# Patient Record
Sex: Female | Born: 2000 | Race: Black or African American | Hispanic: No | Marital: Single | Smoking: Never smoker
Health system: Southern US, Community
[De-identification: ages and names within clinical notes are randomized; demographics above are authoritative.]

## PROBLEM LIST (undated history)

## (undated) DIAGNOSIS — J45909 Unspecified asthma, uncomplicated: Secondary | ICD-10-CM

---
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
## 2022-02-19 ENCOUNTER — Encounter (HOSPITAL_COMMUNITY): Payer: Self-pay

## 2022-02-19 ENCOUNTER — Other Ambulatory Visit: Payer: Self-pay

## 2022-02-19 ENCOUNTER — Emergency Department (HOSPITAL_COMMUNITY)
Admission: EM | Admit: 2022-02-19 | Discharge: 2022-02-19 | Disposition: A | Discharge status: Home / Self Care | Payer: Managed Care, Other (non HMO) | Attending: Emergency Medicine | Admitting: Emergency Medicine

## 2022-02-19 DIAGNOSIS — B2790 Infectious mononucleosis, unspecified without complication: Secondary | ICD-10-CM | POA: Insufficient documentation

## 2022-02-19 DIAGNOSIS — J029 Acute pharyngitis, unspecified: Secondary | ICD-10-CM | POA: Insufficient documentation

## 2022-02-19 HISTORY — DX: Unspecified asthma, uncomplicated: J45.909

## 2022-02-19 LAB — CBC WITH DIFFERENTIAL/PLATELET
Abs Immature Granulocytes: 0 10*3/uL (ref 0.00–0.07)
Basophils Absolute: 0.1 10*3/uL (ref 0.0–0.1)
Basophils Relative: 1 %
Eosinophils Absolute: 0 10*3/uL (ref 0.0–0.5)
Eosinophils Relative: 0 %
HCT: 39.4 % (ref 36.0–46.0)
Hemoglobin: 12.5 g/dL (ref 12.0–15.0)
Lymphocytes Relative: 53 %
Lymphs Abs: 4.1 10*3/uL — ABNORMAL HIGH (ref 0.7–4.0)
MCH: 25.3 pg — ABNORMAL LOW (ref 26.0–34.0)
MCHC: 31.7 g/dL (ref 30.0–36.0)
MCV: 79.8 fL — ABNORMAL LOW (ref 80.0–100.0)
Monocytes Absolute: 0.3 10*3/uL (ref 0.1–1.0)
Monocytes Relative: 4 %
Neutro Abs: 3.3 10*3/uL (ref 1.7–7.7)
Neutrophils Relative %: 42 %
Platelets: 326 10*3/uL (ref 150–400)
RBC: 4.94 MIL/uL (ref 3.87–5.11)
RDW: 14.3 % (ref 11.5–15.5)
WBC: 7.8 10*3/uL (ref 4.0–10.5)
nRBC: 0 % (ref 0.0–0.2)
nRBC: 0 /100 WBC

## 2022-02-19 LAB — BASIC METABOLIC PANEL
Anion gap: 9 (ref 5–15)
BUN: 8 mg/dL (ref 6–20)
CO2: 24 mmol/L (ref 22–32)
Calcium: 9.1 mg/dL (ref 8.9–10.3)
Chloride: 105 mmol/L (ref 98–111)
Creatinine, Ser: 0.57 mg/dL (ref 0.44–1.00)
GFR, Estimated: >60 mL/min (ref >60)
Glucose, Bld: 98 mg/dL (ref 70–99)
Potassium: 3.8 mmol/L (ref 3.5–5.1)
Sodium: 138 mmol/L (ref 135–145)

## 2022-02-19 MED ORDER — KETOROLAC TROMETHAMINE 15 MG/ML IJ SOLN
15.0000 mg | Freq: Once | INTRAMUSCULAR | Status: AC
  Administered 2022-02-19: 15 mg via INTRAVENOUS
  Filled 2022-02-19: qty 1

## 2022-02-19 MED ORDER — SODIUM CHLORIDE 0.9 % IV SOLN
10.0000 mg | Freq: Once | INTRAVENOUS | Status: AC
  Administered 2022-02-19: 10 mg via INTRAVENOUS
  Filled 2022-02-19: qty 1

## 2022-02-19 MED ORDER — SODIUM CHLORIDE 0.9 % IV BOLUS
1000.0000 mL | Freq: Once | INTRAVENOUS | Status: AC
  Administered 2022-02-19: 1000 mL via INTRAVENOUS

## 2023-01-03 ENCOUNTER — Encounter: Payer: Self-pay | Admitting: Family

## 2023-01-03 ENCOUNTER — Ambulatory Visit (INDEPENDENT_AMBULATORY_CARE_PROVIDER_SITE_OTHER): Payer: 59 | Admitting: Family

## 2023-01-03 VITALS — BP 100/67 | HR 84 | Temp 97.8°F | Ht 66.0 in | Wt 100.2 lb

## 2023-01-03 DIAGNOSIS — M549 Dorsalgia, unspecified: Secondary | ICD-10-CM | POA: Diagnosis not present

## 2023-01-03 DIAGNOSIS — N399 Disorder of urinary system, unspecified: Secondary | ICD-10-CM

## 2023-01-03 DIAGNOSIS — J4520 Mild intermittent asthma, uncomplicated: Secondary | ICD-10-CM

## 2023-01-03 MED ORDER — ALBUTEROL SULFATE HFA 108 (90 BASE) MCG/ACT IN AERS: 1.0000 | INHALATION_SPRAY | RESPIRATORY_TRACT | 5 refills | Status: DC | PRN

## 2023-01-03 MED ORDER — NAPROXEN 500 MG PO TABS: 500.0000 mg | ORAL_TABLET | Freq: Two times a day (BID) | ORAL | 0 refills | Status: DC | PRN

## 2023-01-03 NOTE — Patient Instructions (Signed)
Welcome to [facility], It was a pleasure meeting you today!    As discussed, I have sent your Albuterol refill to your pharmacy.  I have sent a referral to our sports medicine office regarding your back pain.  Let us know if any future concerns.    PLEASE NOTE: If you had any LAB tests please let us know if you have not heard back within a few days. You may see your results on MyChart before we have a chance to review them but we will give you a call once they are reviewed by [place]. If we ordered any REFERRALS today, please let us know if you have not heard from their office within the next week.  Let us know through MyChart if you are needing REFILLS, or have your pharmacy send [place] the request. You can also use MyChart to communicate with me or any office staff.

## 2023-01-03 NOTE — Progress Notes (Unsigned)
New Patient Office Visit  Subjective:  Patient ID: Shelley Mcgee, female    DOB: [birth year]  Age: 22 y.o. MRN: [id number]  CC:  Chief Complaint  Patient presents with   New Patient (Initial Visit)   Urinary Retention    Pt c/o unable to control urination, Present for 6 months.    Back Pain    Pt c/o upper back pain, diagnosed with scoliosis in the past.    Asthma    Medication refill of albuterol.     HPI Shelley Mcgee presents for establishing care today.  Upper back pain:  has mild scoliosis in her upper, thoracic spine and lumbar. 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 Reports she has tried to do some stretching to help but twice a day but has not been consistent.  Asthma:  using albuterol rescue inhaler, states her sx are mostly exercise induced.   Urinary problem:  reports having the urge frequently to urinate and if she doesn't get there in time she will leak urine. Was seeing a GYN who started Ditropan but d/t possible issues with wt loss & she wants to gain weight, she has not started. She also was referred to urology but they could not see them for a long time. She has cut back on her caffeine intake, and her stress level also is better and she thinks she is doing better overall. She is on wait list with Urology.  Assessment & Plan:  Mild intermittent asthma without complication -     Albuterol Sulfate HFA; Inhale 1-2 puffs into the lungs every 4 (four) hours as needed for wheezing or shortness of breath.  Dispense: 8 g; Refill: 5  Upper back pain -     Naproxen; Take 1 tablet (500 mg total) by mouth 2 (two) times daily as needed for moderate pain.  Dispense: 60 tablet; Refill: 0 -     Ambulatory referral to Sports Medicine    Subjective:    Outpatient Medications Prior to Visit  Medication Sig Dispense Refill   albuterol (PROAIR HFA) 108 (90 Base) MCG/ACT inhaler INL 2 PFS PO Q 4 TO 6 H PRF BREATHING DIFFICULTIES PRN      medroxyPROGESTERone (DEPO-PROVERA) 150 MG/ML injection INJECT 1 ml Intramuscular every 3 months 1 mL 3   oxybutynin (DITROPAN-XL) 5 MG 24 hr tablet Take 5 mg by mouth daily. (Patient not taking: Reported on 01/03/2023)     No facility-administered medications prior to visit.   Past Medical History:  Diagnosis Date   Asthma    No past surgical history on file.  Objective:   Today's Vitals: BP 100/67   Pulse 84   Temp 97.8 F (36.6 C) (Temporal)   Ht 5\' 6"  (1.676 m)   Wt 100 lb 4 oz (45.5 kg)   LMP 01/02/2023 (Exact Date)   SpO2 100%   BMI 16.18 kg/m   Physical Exam Vitals and nursing note reviewed.  Constitutional:      Appearance: Normal appearance.  Cardiovascular:     Rate and Rhythm: Normal rate and regular rhythm.  Pulmonary:     Effort: Pulmonary effort is normal.     Breath sounds: Normal breath sounds.  Musculoskeletal:        General: Normal range of motion.  Skin:    General: Skin is warm and dry.  Neurological:     Mental Status: She is alert.  Psychiatric:  Mood and Affect: Mood normal.        Behavior: Behavior normal.     Meds ordered this encounter  Medications   albuterol (PROAIR HFA) 108 (90 Base) MCG/ACT inhaler    Sig: Inhale 1-2 puffs into the lungs every 4 (four) hours as needed for wheezing or shortness of breath.    Dispense:  8 g    Refill:  5   naproxen (NAPROSYN) 500 MG tablet    Sig: Take 1 tablet (500 mg total) by mouth 2 (two) times daily as needed for moderate pain.    Dispense:  60 tablet    Refill:  0    Order Specific Question:   Supervising Provider    Answer:   ANDY, CAMILLE L [[id number]]    Dulce Sellar, NP

## 2023-01-04 DIAGNOSIS — N399 Disorder of urinary system, unspecified: Secondary | ICD-10-CM | POA: Insufficient documentation

## 2023-01-04 NOTE — Assessment & Plan Note (Signed)
has seen GYN for problem- urgency, incontinence if can't get to BR quickly, little to no urination at times referred to Urology, long wait, given Oxybutynin but pt reports not taking yet - also given kegal exercises to practice & reducing caffeine intake which pt has done & reports is helping some advised to keep Urology appt can refer to PT for pelvic floor training if needed will continue to monitor

## 2023-01-04 NOTE — Assessment & Plan Note (Signed)
chronic mainly exercise induced refilling rescue inhaler, Albuterol f/u 6 mos or prn

## 2023-03-01 ENCOUNTER — Other Ambulatory Visit: Payer: Self-pay | Admitting: Family

## 2023-03-01 DIAGNOSIS — M549 Dorsalgia, unspecified: Secondary | ICD-10-CM

## 2023-03-25 ENCOUNTER — Telehealth: Payer: 59 | Admitting: Family

## 2023-03-25 ENCOUNTER — Telehealth: Payer: Self-pay | Admitting: Family

## 2023-03-25 ENCOUNTER — Other Ambulatory Visit: Payer: Self-pay

## 2023-03-25 ENCOUNTER — Other Ambulatory Visit (HOSPITAL_COMMUNITY): Payer: Self-pay

## 2023-03-25 VITALS — Temp 102.0°F | Ht 66.0 in | Wt 105.0 lb

## 2023-03-25 DIAGNOSIS — U071 COVID-19: Secondary | ICD-10-CM | POA: Diagnosis not present

## 2023-03-25 MED ORDER — NIRMATRELVIR/RITONAVIR (PAXLOVID)TABLET: 3.0000 | ORAL_TABLET | Freq: Two times a day (BID) | ORAL | 0 refills | Status: DC

## 2023-03-25 MED ORDER — NIRMATRELVIR/RITONAVIR (PAXLOVID)TABLET
3.0000 | ORAL_TABLET | Freq: Two times a day (BID) | ORAL | 0 refills | Status: AC
  Filled 2023-03-25: qty 30, 5d supply, fill #0

## 2023-03-25 MED ORDER — PAXLOVID (300/100) 20 X 150 MG & 10 X 100MG PO TBPK
ORAL_TABLET | ORAL | 0 refills | Status: AC
  Filled 2023-03-25: qty 30, 5d supply, fill #0

## 2023-03-25 NOTE — Telephone Encounter (Signed)
Pt states [facility] is out of  nirmatrelvir/ritonavir (PAXLOVID) 20 x 150 MG & 10 x 100MG  TABS  Can you resend RX to [facility] on [address]. Please advise.

## 2023-03-25 NOTE — Progress Notes (Signed)
MyChart Video Visit    Virtual Visit via Video Note   This format is felt to be most appropriate for this patient at this time. Physical exam was limited by quality of the video and audio technology used for the visit. CMA was able to get the patient set up on a video visit.  Patient location: Home. Patient and provider in visit Provider location: Office  I discussed the limitations of evaluation and management by telemedicine and the availability of in person appointments. The patient expressed understanding and agreed to proceed.  Visit Date: 03/25/2023  Today's healthcare provider: Dulce Sellar, NP     Subjective:   Patient ID: Shelley Mcgee, female    DOB: [birth year], 22 y.o.   MRN: [id number]  Chief Complaint  Patient presents with   Covid Positive    sx for 2d    HPI Covid:  Pt tested positive for covid yesterday,Sx started yesterday. Pt c/o Cough,fever of 102.0, Sore throat, body aches,chills and headaches. Denies diarrhea or loss of taste or smell. Has tried ibuprofen which did not help sx.  Assessment & Plan:  COVID-19- Sending Paxlovid, pt advised of use & SE. Advised of CDC guidelines for masking if out in public. OK to continue taking OTC sinus or pain meds. Encouraged to monitor & notify office of any worsening symptoms: increased shortness of breath, weakness, and signs of dehydration. Instructed to rest and hydrate well.   -     nirmatrelvir/ritonavir; Take 3 tablets by mouth 2 (two) times daily for 5 days. (Take nirmatrelvir 150 mg two tablets twice daily for 5 days and ritonavir 100 mg one tablet twice daily for 5 days)  Dispense: 30 tablet; Refill: 0  Past Medical History:  Diagnosis Date   Asthma     No past surgical history on file.  Outpatient Medications Prior to Visit  Medication Sig Dispense Refill   albuterol (PROAIR HFA) 108 (90 Base) MCG/ACT inhaler Inhale 1-2 puffs into the lungs every 4 (four) hours as needed for wheezing or shortness  of breath. 8 g 5   naproxen (NAPROSYN) 500 MG tablet TAKE 1 TABLET(500 MG) BY MOUTH TWICE DAILY AS NEEDED FOR MODERATE PAIN 60 tablet 0   oxybutynin (DITROPAN-XL) 5 MG 24 hr tablet Take 5 mg by mouth daily.     No facility-administered medications prior to visit.    Allergies  Allergen Reactions   Penicillins Hives       Objective:   Physical Exam Vitals and nursing note reviewed.  Constitutional:      General: Pt is not in acute distress.    Appearance: Normal appearance.  HENT:     Head: Normocephalic.  Pulmonary:     Effort: No respiratory distress.  Musculoskeletal:     Cervical back: Normal range of motion.  Skin:    General: Skin is dry.     Coloration: Skin is not pale.  Neurological:     Mental Status: Pt is alert and oriented to person, place, and time.  Psychiatric:        Mood and Affect: Mood normal.   Temp (!) 102 F (38.9 C) (Oral)   Ht 5\' 6"  (1.676 m)   Wt 105 lb (47.6 kg)   BMI 16.95 kg/m   Wt Readings from Last 3 Encounters:  03/25/23 105 lb (47.6 kg)  01/03/23 100 lb 4 oz (45.5 kg) (4%, Z= -1.80)*  02/19/22 100 lb (45.4 kg) (4%, Z= -1.77)*   * Growth percentiles are  based on CDC (Girls, 2-20 Years) data.      I discussed the assessment and treatment plan with the patient. The patient was provided an opportunity to ask questions and all were answered. The patient agreed with the plan and demonstrated an understanding of the instructions.   The patient was advised to call back or seek an in-person evaluation if the symptoms worsen or if the condition fails to improve as anticipated.  Dulce Sellar, NP [facility] [place] [phone] (phone) [phone] (fax)  [facility]

## 2023-05-14 ENCOUNTER — Other Ambulatory Visit (HOSPITAL_COMMUNITY): Payer: Self-pay

## 2023-05-16 ENCOUNTER — Other Ambulatory Visit (HOSPITAL_COMMUNITY): Payer: Self-pay

## 2024-01-11 ENCOUNTER — Other Ambulatory Visit: Payer: Self-pay | Admitting: Family

## 2024-01-11 DIAGNOSIS — J4520 Mild intermittent asthma, uncomplicated: Secondary | ICD-10-CM

## 2024-02-06 ENCOUNTER — Encounter: Admitting: Family
